# Patient Record
Sex: Male | Born: 1960 | Race: White | Hispanic: No | Marital: Married | Smoking: Former smoker
Health system: Southern US, Community
[De-identification: ages and names within clinical notes are randomized; demographics above are authoritative.]

## PROBLEM LIST (undated history)

## (undated) DIAGNOSIS — Z8669 Personal history of other diseases of the nervous system and sense organs: Secondary | ICD-10-CM

## (undated) HISTORY — PX: OTHER SURGICAL HISTORY: SHX169

## (undated) HISTORY — DX: Personal history of other diseases of the nervous system and sense organs: Z86.69

---

## 2004-11-15 ENCOUNTER — Emergency Department (HOSPITAL_COMMUNITY): Admission: EM | Admit: 2004-11-15 | Discharge: 2004-11-15 | Payer: Self-pay | Admitting: Emergency Medicine

## 2010-11-18 ENCOUNTER — Encounter: Payer: Self-pay | Admitting: Family Medicine

## 2010-11-18 ENCOUNTER — Ambulatory Visit (INDEPENDENT_AMBULATORY_CARE_PROVIDER_SITE_OTHER): Payer: BC Managed Care – PPO | Admitting: Family Medicine

## 2010-11-18 VITALS — BP 135/79 | HR 55 | Temp 98.0°F | Ht 69.5 in | Wt 164.8 lb

## 2010-11-18 DIAGNOSIS — S0033XA Contusion of nose, initial encounter: Secondary | ICD-10-CM

## 2010-11-18 DIAGNOSIS — S1093XA Contusion of unspecified part of neck, initial encounter: Secondary | ICD-10-CM

## 2010-11-18 DIAGNOSIS — S0003XA Contusion of scalp, initial encounter: Secondary | ICD-10-CM

## 2010-11-18 NOTE — Progress Notes (Signed)
Office Note 11/18/2010  CC:  Chief Complaint  Patient presents with  . Establish Care    new patient  . Facial Injury    ? broken nose X 1 day    HPI:  Michael Downs is a 50 y.o. White male who is here to establish care and discuss recent nose injury. Patient's most recent primary MD: none Old records were not reviewed prior to or during today's visit.  Yesterday he was using a drill to work on a piece of wood at his home and the bit got stuck and the wood spun and struck him in the nose. A small cut on the bridge bled some but he had no bleeding from the nares.  No LOC.  He has had mild pain in the nasal bridge since then, takes tylenol and this helps adequately.  He reports that his nasal bridge was already a bit deviated to the right and a sinus CT scan done in the past for w/u of HA's did show a deviated septum at that time.  Denies any problem breathing through either nostril.  No vision complaints.  No teeth complaints.    Past Medical History  Diagnosis Date  . Migraine   . Lipoma     multiple (right thigh, right hip, torso)    Past Surgical History  Procedure Date  . Right hand surgery 25 yrs ago    for traumatic wound to fingers    No family history on file.  History   Social History  . Marital Status: Married    Spouse Name: N/A    Number of Children: N/A  . Years of Education: N/A   Occupational History  . Not on file.   Social History Main Topics  . Smoking status: Former Smoker    Types: Cigarettes    Quit date: 03/31/1988  . Smokeless tobacco: Never Used  . Alcohol Use: Yes     2 or 3 a month  . Drug Use: No  . Sexually Active: Yes -- Male partner(s)   Other Topics Concern  . Not on file   Social History Narrative   Married, two teenage children at home. 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  He's originally from [place].No tobacco, rare ETOH, no drugs.Exercise --at work only.    Outpatient  Encounter Prescriptions as of 11/18/2010  Medication Sig Dispense Refill  . acetaminophen (TYLENOL) 500 MG tablet Take 500 mg by mouth every 6 (six) hours as needed.          Allergies  Allergen Reactions  . Bee Venom   . Influenza Vaccine Live Other (See Comments)    Both eyes turned red per pt report    ROS Review of Systems  Constitutional: Negative for fever and fatigue.  HENT: Negative for congestion and sore throat.   Eyes: Negative for visual disturbance.  Respiratory: Negative for cough.   Cardiovascular: Negative for chest pain.  Gastrointestinal: Negative for nausea and abdominal pain.  Genitourinary: Negative for dysuria.  Musculoskeletal: Negative for back pain and joint swelling.  Skin: Negative for rash.  Neurological: Negative for weakness and headaches.  Hematological: Negative for adenopathy.     PE; Blood pressure 135/79, pulse 55, temperature 98 F (36.7 C), temperature source Oral, height 5' 9.5" (1.765 m), weight 164 lb 12.8 oz (74.753 kg), SpO2 100.00%. Gen: Alert, well appearing.  Patient is oriented to person, place, time, and situation. HEENT: Scalp without lesions or hair loss.  Ears:  EACs clear, normal epithelium.  TMs with good light reflex and landmarks bilaterally.  Eyes: no injection, icteris, swelling, or exudate.  EOMI, PERRLA. Nose: Slight deviation of the bridge to the right, with mild TTP in proximal nasal bridge and slight bruising and soft tissue swelling in this area.  Small superficial lac on left side of bridge without surrounding erythema.   He has slight deviation of the septum to the right, without any blood or hematoma visible.  No nasal obtruction.   Remainder of facial bones without any tenderness.  Mouth: lips without lesion/swelling.  Oral mucosa pink and moist.  Dentition intact and without obvious caries or gingival swelling.  Oropharynx without erythema, exudate, or swelling.   Pertinent labs:  none  ASSESSMENT AND PLAN:   New  patient:  Contusion, nose He may have a fracture of the bridge of the nose, but from his description of his nose prior to the injury I don't think there is any NEW deformity. We discussed options today of nose imaging, ENT referral, or simply pain control and observation. Ultimately we decided on continued pain control with tylenol and no imaging or referral at this time.     Return if symptoms worsen or fail to improve, for make appt at your convenience for CPE.

## 2010-11-18 NOTE — Assessment & Plan Note (Signed)
He may have a fracture of the bridge of the nose, but from his description of his nose prior to the injury I don't think there is any NEW deformity. We discussed options today of nose imaging, ENT referral, or simply pain control and observation. Ultimately we decided on continued pain control with tylenol and no imaging or referral at this time.

## 2012-07-19 ENCOUNTER — Ambulatory Visit (INDEPENDENT_AMBULATORY_CARE_PROVIDER_SITE_OTHER): Payer: BC Managed Care – PPO | Admitting: Family Medicine

## 2012-07-19 ENCOUNTER — Encounter: Payer: Self-pay | Admitting: Family Medicine

## 2012-07-19 VITALS — BP 112/73 | HR 71 | Temp 98.2°F | Resp 16 | Wt 175.5 lb

## 2012-07-19 DIAGNOSIS — L723 Sebaceous cyst: Secondary | ICD-10-CM

## 2012-07-19 DIAGNOSIS — L720 Epidermal cyst: Secondary | ICD-10-CM | POA: Insufficient documentation

## 2012-07-19 NOTE — Progress Notes (Signed)
OFFICE NOTE  07/19/2012  CC:  Chief Complaint  Patient presents with  . Cyst    Pt c/o painful cyst on posterior right leg, top of thigh x5 days, without drainage.     HPI: Patient is a 52 y.o. Caucasian male who is here for focal swelling in back of right thigh.  He first noted it 4-5d/a.  No drainage. Located medially, painful to touch somewhat and painful when he sits.  When he stands or walks he is fine. No fever, no malaise.  Energy level and appetite are good.  Pertinent PMH:  Past Medical History  Diagnosis Date  . Migraine   . Lipoma     multiple (right thigh, right hip, torso)    MEDS:  Outpatient Prescriptions Prior to Visit  Medication Sig Dispense Refill  . acetaminophen (TYLENOL) 500 MG tablet Take 500 mg by mouth every 6 (six) hours as needed.         No facility-administered medications prior to visit.    PE: Blood pressure 112/73, pulse 71, temperature 98.2 F (36.8 C), temperature source Oral, resp. rate 16, weight 175 lb 8 oz (79.606 kg), SpO2 100.00%. Gen: Alert, well appearing.  Patient is oriented to person, place, time, and situation. Medial, upper portion of right thigh (posteriorly) has a 2-3 cm palpable subQ nodule that is mildly fluctuant in the center and with overlying pearly hue to skin but no skin opening.  No erythema.  Mild tenderness to palpation.     IMPRESSION AND PLAN:  Epidermal inclusion cyst Discussed conservative approach: apply HEAT every night as much as he can and if drainage begins then gently squeeze/massage the surrounding tissue to empty to cyst. If not better in 3d then at his f/u visit we'll do I &D No sign of infection at this time.  An After Visit Summary was printed and given to the patient.  FOLLOW UP: 3d

## 2012-07-19 NOTE — Assessment & Plan Note (Signed)
Discussed conservative approach: apply HEAT every night as much as he can and if drainage begins then gently squeeze/massage the surrounding tissue to empty to cyst. If not better in 3d then at his f/u visit we'll do I &D No sign of infection at this time.

## 2012-07-22 ENCOUNTER — Encounter: Payer: Self-pay | Admitting: Family Medicine

## 2012-07-22 ENCOUNTER — Ambulatory Visit (INDEPENDENT_AMBULATORY_CARE_PROVIDER_SITE_OTHER): Payer: BC Managed Care – PPO | Admitting: Family Medicine

## 2012-07-22 VITALS — BP 134/77 | HR 63 | Temp 98.5°F | Resp 16 | Wt 174.8 lb

## 2012-07-22 DIAGNOSIS — L0291 Cutaneous abscess, unspecified: Secondary | ICD-10-CM

## 2012-07-22 DIAGNOSIS — L0390 Cellulitis, unspecified: Secondary | ICD-10-CM

## 2012-07-22 MED ORDER — HYDROCODONE-ACETAMINOPHEN 5-325 MG PO TABS: ORAL_TABLET | ORAL | Status: DC

## 2012-07-22 NOTE — Progress Notes (Signed)
OFFICE NOTE  07/22/2012  CC:  Chief Complaint  Patient presents with  . Follow-up    3-day [Cyst on posterior right thigh] w/pain     HPI: Patient is a 52 y.o. Caucasian male who is here for 3 day f/u of cyst/boil on left posterior/medial thigh. He reports worse pain, enlarging lesion.  No fever or malaise.  Not taking any med for pain. Used an exacto knife last night to poke a hole in it and got minimal return of a substance that was foul smelling.  Pertinent PMH:  Past Medical History  Diagnosis Date  . Migraine   . Lipoma     multiple (right thigh, right hip, torso)     MEDS:  NONE  PE: Blood pressure 134/77, pulse 63, temperature 98.5 F (36.9 C), temperature source Oral, resp. rate 16, weight 174 lb 12 oz (79.266 kg), SpO2 99.00%. Gen: Alert, well appearing.  Patient is oriented to person, place, time, and situation. Left medial gluteal region with golf ball sized subQ swelling that is firm and tender and mildly eythematous.  No drainage opening.  This area of swelling is well circumscribed and is not in the region of the anus.  IMPRESSION AND PLAN:  Gluteal cyst or abscess. Needs incision and drainage today-failed conservative mgmt.  Procedure: Incision and drainage of abscess/cyst.  The indication for the procedure was explained to the patient, benefits and risks of procedure were outlined for patient, patient agreed to proceed.  Steps of the procedure were clearly explained to the patient prior to starting. Injected lesion with 3 ml of 2% lidocaine with epi for local anesthesia.  Incised central portion of lesion and cut out a small ellipse of skin and subQ tissue with scalpel and used manual pressure and hemostats to express contents and encourage complete drainage.  Culture swab of lesion contents obtained and sent to lab.  Wound dressed but not packed.  No bleeding.  Patient tolerated procedure well.  No immediate complications.  Wound care instructions given.   Warning signs of infection discussed. Follow up discussed--6d with me, earlier if fevers or increasing pain/swelling.  Call or return for problems. Ibuprofen 600mg  tid with food. Gave vicodin 5/325, 1-2 tabs q6h prn severe pain, #20, no RF.  Therapeutic expectations and side effect profile of medication discussed today.  Patient's questions answered.

## 2012-07-26 LAB — WOUND CULTURE

## 2012-07-28 ENCOUNTER — Ambulatory Visit (INDEPENDENT_AMBULATORY_CARE_PROVIDER_SITE_OTHER): Payer: BC Managed Care – PPO | Admitting: Family Medicine

## 2012-07-28 ENCOUNTER — Encounter: Payer: Self-pay | Admitting: Family Medicine

## 2012-07-28 VITALS — BP 116/74 | HR 52 | Temp 98.0°F | Resp 16 | Wt 174.0 lb

## 2012-07-28 DIAGNOSIS — L723 Sebaceous cyst: Secondary | ICD-10-CM

## 2012-07-28 DIAGNOSIS — L720 Epidermal cyst: Secondary | ICD-10-CM

## 2012-07-28 NOTE — Progress Notes (Signed)
OFFICE NOTE  07/28/2012  CC:  Chief Complaint  Patient presents with  . Follow-up    Post I&D Cyst     HPI: Patient is a 52 y.o. Caucasian male who is here for 3d f/u for I & D of gluteal cyst/boil. His wound culture came back negative.  His cyst is about 75% healed.  He feels good.  Pertinent PMH:  Past Medical History  Diagnosis Date  . Migraine   . Lipoma     multiple (right thigh, right hip, torso)    MEDS:  Outpatient Prescriptions Prior to Visit  Medication Sig Dispense Refill  . acetaminophen (TYLENOL) 500 MG tablet Take 500 mg by mouth every 6 (six) hours as needed.        Marland Kitchen HYDROcodone-acetaminophen (NORCO/VICODIN) 5-325 MG per tablet 1-2 tabs po q6h prn severe pain  20 tablet  0   No facility-administered medications prior to visit.    PE: Blood pressure 116/74, pulse 52, temperature 98 F (36.7 C), temperature source Oral, resp. rate 16, weight 174 lb (78.926 kg), SpO2 99.00%. Gen: Alert, well appearing.  Patient is oriented to person, place, time, and situation. Left glut: minimal induration/swelling of the tissue is palpable around the small incision/opening in his skin.  No erythema, tenderness, or active drainage.  IMPRESSION AND PLAN:  Skin cyst, s/p I&D 3 d/a---doing great. Continue to cover until sealed at surface. Continue heat until sealed. Signs/symptoms to call or return for were reviewed and pt expressed understanding.   FOLLOW UP: prn

## 2017-01-22 DIAGNOSIS — Z23 Encounter for immunization: Secondary | ICD-10-CM | POA: Diagnosis not present

## 2017-12-11 ENCOUNTER — Ambulatory Visit: Payer: BLUE CROSS/BLUE SHIELD | Admitting: Family Medicine

## 2017-12-11 ENCOUNTER — Encounter: Payer: Self-pay | Admitting: Family Medicine

## 2017-12-11 VITALS — BP 123/70 | HR 53 | Temp 98.4°F | Resp 16 | Ht 70.0 in | Wt 183.0 lb

## 2017-12-11 DIAGNOSIS — Z1211 Encounter for screening for malignant neoplasm of colon: Secondary | ICD-10-CM | POA: Diagnosis not present

## 2017-12-11 DIAGNOSIS — Z23 Encounter for immunization: Secondary | ICD-10-CM

## 2017-12-11 DIAGNOSIS — Z0000 Encounter for general adult medical examination without abnormal findings: Secondary | ICD-10-CM

## 2017-12-11 DIAGNOSIS — Z125 Encounter for screening for malignant neoplasm of prostate: Secondary | ICD-10-CM

## 2017-12-11 NOTE — Progress Notes (Signed)
Office Note 12/11/2017  CC:  Chief Complaint  Patient presents with  . Establish Care    Previous PCP: Unknown  . Hand Pain    right 3rd digit    HPI:  Michael Downs is a 57 y.o.  male who is here to re-establish care. Patient's most recent primary MD: none (I last saw him 5 yrs ago). Old records in [facility] EMR reviewed prior to or during today's visit.  Updated chart, no new information .  Feeling well. Exercise: only at work. Diet: I eat anything and everything. Eyes; annual exam UTD. Dental annual exam UTD.  Past Medical History:  Diagnosis Date  . History of migraine headaches   . Lipoma    multiple (right thigh, right hip, torso)    Past Surgical History:  Procedure Laterality Date  . right hand surgery  25 yrs ago   for traumatic wound to fingers    History reviewed. No pertinent family history.  Social History   Socioeconomic History  . Marital status: Married    Spouse name: Not on file  . Number of children: Not on file  . Years of education: Not on file  . Highest education level: Not on file  Occupational History  . Not on file  Social Needs  . Financial resource strain: Not on file  . Food insecurity:    Worry: Not on file    Inability: Not on file  . Transportation needs:    Medical: Not on file    Non-medical: Not on file  Tobacco Use  . Smoking status: Former Smoker    Types: Cigarettes    Last attempt to quit: 03/31/1988    Years since quitting: 29.7  . Smokeless tobacco: Never Used  Substance and Sexual Activity  . Alcohol use: Yes    Comment: 2 or 3 a month  . Drug use: No  . Sexual activity: Yes    Partners: Female  Lifestyle  . Physical activity:    Days per week: Not on file    Minutes per session: Not on file  . Stress: Not on file  Relationships  . Social connections:    Talks on phone: Not on file    Gets together: Not on file    Attends religious service: Not on file    Active member of club or organization:  Not on file    Attends meetings of clubs or organizations: Not on file    Relationship status: Not on file  . Intimate partner violence:    Fear of current or ex partner: Not on file    Emotionally abused: Not on file    Physically abused: Not on file    Forced sexual activity: Not on file  Other Topics Concern  . Not on file  Social History Narrative   Married, two teenage children at home.    Occupation: Editor, commissioning for [employer].   Enjoys woodworking.  He's originally from [place].   No tobacco, rare ETOH, no drugs.   Exercise --at work only.    Outpatient Encounter Medications as of 12/11/2017  Medication Sig  . acetaminophen (TYLENOL) 500 MG tablet Take 500 mg by mouth every 6 (six) hours as needed.    Marland Kitchen HYDROcodone-acetaminophen (NORCO/VICODIN) 5-325 MG per tablet 1-2 tabs po q6h prn severe pain (Patient not taking: Reported on 12/11/2017)   No facility-administered encounter medications on file as of 12/11/2017.     Allergies  Allergen Reactions  .  Bee Venom   . Influenza Vaccine Live Other (See Comments)    Both eyes turned red per pt report    ROS Review of Systems  Constitutional: Negative for appetite change, chills, fatigue and fever.  HENT: Negative for congestion, dental problem, ear pain and sore throat.   Eyes: Negative for discharge, redness and visual disturbance.  Respiratory: Negative for cough, chest tightness, shortness of breath and wheezing.   Cardiovascular: Negative for chest pain, palpitations and leg swelling.  Gastrointestinal: Negative for abdominal pain, blood in stool, diarrhea, nausea and vomiting.  Genitourinary: Negative for difficulty urinating, dysuria, flank pain, frequency, hematuria and urgency.  Musculoskeletal: Negative for arthralgias, back pain, joint swelling, myalgias and neck stiffness.  Skin: Negative for pallor and rash.  Neurological: Negative for dizziness, speech difficulty, weakness and headaches.   Hematological: Negative for adenopathy. Does not bruise/bleed easily.  Psychiatric/Behavioral: Negative for confusion and sleep disturbance. The patient is not nervous/anxious.     PE; Blood pressure 123/70, pulse (!) 53, temperature 98.4 F (36.9 C), temperature source Oral, resp. rate 16, height 5\' 10"  (1.778 m), weight 183 lb (83 kg), SpO2 97 %. Gen: Alert, well appearing.  Patient is oriented to person, place, time, and situation. AFFECT: pleasant, lucid thought and speech. ENT: Ears: EACs clear, normal epithelium.  TMs with good light reflex and landmarks bilaterally.  Eyes: no injection, icteris, swelling, or exudate.  EOMI, PERRLA. Nose: no drainage or turbinate edema/swelling.  No injection or focal lesion.  Mouth: lips without lesion/swelling.  Oral mucosa pink and moist.  Dentition intact and without obvious caries or gingival swelling.  Oropharynx without erythema, exudate, or swelling.  Neck: supple/nontender.  No LAD, mass, or TM.  Carotid pulses 2+ bilaterally, without bruits. CV: RRR, no m/r/g.   LUNGS: CTA bilat, nonlabored resps, good aeration in all lung fields. ABD: soft, NT, ND, BS normal.  No hepatospenomegaly or mass.  No bruits. EXT: no clubbing, cyanosis, or edema.  Musculoskeletal: no joint swelling, erythema, warmth, or tenderness.  ROM of all joints intact. Skin - no sores or suspicious lesions or rashes or color changes Rectal exam: negative without mass, lesions or tenderness, PROSTATE EXAM: smooth and symmetric without nodules or tenderness.  Pertinent labs:   none   ASSESSMENT AND PLAN:   New/re-establishing patient.  Health maintenance exam: Reviewed age and gender appropriate health maintenance issues (prudent diet, regular exercise, health risks of tobacco and excessive alcohol, use of seatbelts, fire alarms in home, use of sunscreen).  Also reviewed age and gender appropriate health screening as well as vaccine recommendations. Vaccines: tdap today.   He gets flu vaccine at work. Labs: fasting HP labs discussed, but since we're not sure if these are covered by his insurer when linked to HM exam, he will call insurer to check. Prostate ca screening:  DRE normal.  PSA to be done when pt returns for lab visit. Colon ca screening: he prefers fecal immunochemical testing so I ordered this today.  He understands that colonoscopy is the next step if this returns positive.  An After Visit Summary was printed and given to the patient.  Return in about 1 year (around 12/12/2018) for annual CPE (fasting).  Pt to call back and make fasting labs appointment at his convenience..  Signed:  Crissie Sickles, MD           12/11/2017

## 2017-12-11 NOTE — Patient Instructions (Addendum)
Call your insurer and ask if your plan covers routine blood work at physical exams (CBC, metabolic panel, TSH, and cholesterol panel).  Also, ask if they cover shingrix vaccine (shingles vaccine).  Make a lab appointment at our office to get blood drawn (even if your insurer does not cover the other blood tests, they will cover the prostate blood test b/c it is part of prostate cancer screening.   Health Maintenance, Male A healthy lifestyle and preventive care is important for your health and wellness. Ask your health care provider about what schedule of regular examinations is right for you. What should I know about weight and diet? Eat a Healthy Diet  Eat plenty of vegetables, fruits, whole grains, low-fat dairy products, and lean protein.  Do not eat a lot of foods high in solid fats, added sugars, or salt.  Maintain a Healthy Weight Regular exercise can help you achieve or maintain a healthy weight. You should:  Do at least 150 minutes of exercise each week. The exercise should increase your heart rate and make you sweat (moderate-intensity exercise).  Do strength-training exercises at least twice a week.  Watch Your Levels of Cholesterol and Blood Lipids  Have your blood tested for lipids and cholesterol every 5 years starting at 56 years of age. If you are at high risk for heart disease, you should start having your blood tested when you are 56 years old. You may need to have your cholesterol levels checked more often if: ? Your lipid or cholesterol levels are high. ? You are older than 56 years of age. ? You are at high risk for heart disease.  What should I know about cancer screening? Many types of cancers can be detected early and may often be prevented. Lung Cancer  You should be screened every year for lung cancer if: ? You are a current smoker who has smoked for at least 30 years. ? You are a former smoker who has quit within the past 15 years.  Talk to your health  care provider about your screening options, when you should start screening, and how often you should be screened.  Colorectal Cancer  Routine colorectal cancer screening usually begins at 56 years of age and should be repeated every 5-10 years until you are 56 years old. You may need to be screened more often if early forms of precancerous polyps or small growths are found. Your health care provider may recommend screening at an earlier age if you have risk factors for colon cancer.  Your health care provider may recommend using home test kits to check for hidden blood in the stool.  A small camera at the end of a tube can be used to examine your colon (sigmoidoscopy or colonoscopy). This checks for the earliest forms of colorectal cancer.  Prostate and Testicular Cancer  Depending on your age and overall health, your health care provider may do certain tests to screen for prostate and testicular cancer.  Talk to your health care provider about any symptoms or concerns you have about testicular or prostate cancer.  Skin Cancer  Check your skin from head to toe regularly.  Tell your health care provider about any new moles or changes in moles, especially if: ? There is a change in a mole's size, shape, or color. ? You have a mole that is larger than a pencil eraser.  Always use sunscreen. Apply sunscreen liberally and repeat throughout the day.  Protect yourself by wearing long sleeves,  pants, a wide-brimmed hat, and sunglasses when outside.  What should I know about heart disease, diabetes, and high blood pressure?  If you are 44-36 years of age, have your blood pressure checked every 3-5 years. If you are 59 years of age or older, have your blood pressure checked every year. You should have your blood pressure measured twice-once when you are at a hospital or clinic, and once when you are not at a hospital or clinic. Record the average of the two measurements. To check your blood  pressure when you are not at a hospital or clinic, you can use: ? An automated blood pressure machine at a pharmacy. ? A home blood pressure monitor.  Talk to your health care provider about your target blood pressure.  If you are between 64-81 years old, ask your health care provider if you should take aspirin to prevent heart disease.  Have regular diabetes screenings by checking your fasting blood sugar level. ? If you are at a normal weight and have a low risk for diabetes, have this test once every three years after the age of 40. ? If you are overweight and have a high risk for diabetes, consider being tested at a younger age or more often.  A one-time screening for abdominal aortic aneurysm (AAA) by ultrasound is recommended for men aged 53-75 years who are current or former smokers. What should I know about preventing infection? Hepatitis B If you have a higher risk for hepatitis B, you should be screened for this virus. Talk with your health care provider to find out if you are at risk for hepatitis B infection. Hepatitis C Blood testing is recommended for:  Everyone born from [birth year] through 1964.  Anyone with known risk factors for hepatitis C.  Sexually Transmitted Diseases (STDs)  You should be screened each year for STDs including gonorrhea and chlamydia if: ? You are sexually active and are younger than 56 years of age. ? You are older than 56 years of age and your health care provider tells you that you are at risk for this type of infection. ? Your sexual activity has changed since you were last screened and you are at an increased risk for chlamydia or gonorrhea. Ask your health care provider if you are at risk.  Talk with your health care provider about whether you are at high risk of being infected with HIV. Your health care provider may recommend a prescription medicine to help prevent HIV infection.  What else can I do?  Schedule regular health, dental, and eye  exams.  Stay current with your vaccines (immunizations).  Do not use any tobacco products, such as cigarettes, chewing tobacco, and e-cigarettes. If you need help quitting, ask your health care provider.  Limit alcohol intake to no more than 2 drinks per day. One drink equals 12 ounces of beer, 5 ounces of wine, or 1 ounces of hard liquor.  Do not use street drugs.  Do not share needles.  Ask your health care provider for help if you need support or information about quitting drugs.  Tell your health care provider if you often feel depressed.  Tell your health care provider if you have ever been abused or do not feel safe at home. This information is not intended to replace advice given to you by your health care provider. Make sure you discuss any questions you have with your health care provider. Document Released: 09/13/2007 Document Revised: 11/14/2015 Document Reviewed: 12/19/2014 Elsevier  Interactive Patient Education  [employer].

## 2017-12-11 NOTE — Addendum Note (Signed)
Addended by: Onalee Hua on: 12/11/2017 03:57 PM   Modules accepted: Orders

## 2018-02-10 DIAGNOSIS — Z23 Encounter for immunization: Secondary | ICD-10-CM | POA: Diagnosis not present

## 2018-07-16 DIAGNOSIS — H16042 Marginal corneal ulcer, left eye: Secondary | ICD-10-CM | POA: Diagnosis not present

## 2020-03-09 ENCOUNTER — Encounter: Payer: Self-pay | Admitting: Family Medicine

## 2020-03-09 ENCOUNTER — Ambulatory Visit (INDEPENDENT_AMBULATORY_CARE_PROVIDER_SITE_OTHER): Payer: BC Managed Care – PPO | Admitting: Family Medicine

## 2020-03-09 ENCOUNTER — Other Ambulatory Visit: Payer: Self-pay

## 2020-03-09 VITALS — BP 131/75 | HR 49 | Temp 97.9°F | Resp 16 | Ht 70.0 in | Wt 173.4 lb

## 2020-03-09 DIAGNOSIS — Z23 Encounter for immunization: Secondary | ICD-10-CM | POA: Diagnosis not present

## 2020-03-09 DIAGNOSIS — D1722 Benign lipomatous neoplasm of skin and subcutaneous tissue of left arm: Secondary | ICD-10-CM

## 2020-03-09 NOTE — Addendum Note (Signed)
Addended by: Deveron Furlong D on: 03/09/2020 04:22 PM   Modules accepted: Orders

## 2020-03-09 NOTE — Progress Notes (Signed)
OFFICE VISIT  03/09/2020  CC:  Chief Complaint  Patient presents with  . Bump under arm    Left, x 3-4 days    HPI:    Patient is a 59 y.o. male who presents for "bump under skin on arm". He noted small focal swelling/bump on left forearm and a little larger one on left upper arm sometime about 3-4 d/a.  No pain or itching, no skin changes. Has several lipomas on other areas of his body. Also hx of epidermal inclusion cyst that I I&Dd in the remote past.  Past Medical History:  Diagnosis Date  . History of migraine headaches   . Lipoma    multiple (right thigh, right hip, torso)    Past Surgical History:  Procedure Laterality Date  . right hand surgery  25 yrs ago   for traumatic wound to fingers    MEDS: none  Allergies  Allergen Reactions  . Bee Venom   . Influenza Vaccine Live Other (See Comments)    Both eyes turned red per pt report   ROS As per HPI  PE: Vitals with BMI 03/09/2020 12/11/2017 07/28/2012  Height 5\' 10"  5\' 10"  -  Weight 173 lbs 6 oz 183 lbs 174 lbs  BMI 70.35 00.93 -  Systolic 818 299 371  Diastolic 75 70 74  Pulse 49 53 52    Gen: Alert, well appearing.  Patient is oriented to person, place, time, and situation. Pea-sized subQ rubbery round nodule in volar aspect of L forearm---about 12-14 cm distal to antecubital fossa. Moveable, nontender, not attached to tendon. Similar lesion but about marble sized---located in anterolateral aspect of L upper arm about 12 cm proximal to antecubital fossa.   LABS:  none  IMPRESSION AND PLAN:  Left arm subQ nodules, most consistent with lipomas but also could be epidermal inclusion cysts.  Reassured pt of benign nature of the lesions.  Watchful waiting/obs approach.  Pt expressed interest in shingrix vaccine so we gave shingrix #1 today and he'll return in 2-6 months for injection #2.  An After Visit Summary was printed and given to the patient.  FOLLOW UP: Return for return as  needed.  Signed:  Crissie Sickles, MD           03/09/2020

## 2020-04-12 DIAGNOSIS — Z20822 Contact with and (suspected) exposure to covid-19: Secondary | ICD-10-CM | POA: Diagnosis not present

## 2020-12-13 ENCOUNTER — Other Ambulatory Visit: Payer: Self-pay

## 2020-12-13 ENCOUNTER — Ambulatory Visit (INDEPENDENT_AMBULATORY_CARE_PROVIDER_SITE_OTHER): Payer: BC Managed Care – PPO | Admitting: Family Medicine

## 2020-12-13 ENCOUNTER — Encounter: Payer: Self-pay | Admitting: Family Medicine

## 2020-12-13 VITALS — BP 122/74 | HR 57 | Temp 98.2°F | Resp 15 | Ht 70.0 in | Wt 170.2 lb

## 2020-12-13 DIAGNOSIS — R1011 Right upper quadrant pain: Secondary | ICD-10-CM | POA: Diagnosis not present

## 2020-12-13 DIAGNOSIS — Z23 Encounter for immunization: Secondary | ICD-10-CM

## 2020-12-13 NOTE — Progress Notes (Signed)
Subjective:  Patient ID: Michael Downs, male    DOB: 1960-09-07  Age: 60 y.o. MRN: [id number]  CC:  Chief Complaint  Patient presents with   Flank Pain    Pt reports Rt side pain at random yesterday went away last nights, felt pinches last night and this morning, denies confusion, numbness and tingling, no chest pains or palpitations.     HPI Michael Downs presents for  Flank pain:  Under R ribcage, noted yesterday am. Felt as stood up to work. Progressively worse, but able to work through pain. No fever, nausea/vomiting. No R shoulder pain. Pain improved during day. Just slight pressure. Felt ok this am, pinch when he stood up and again another time - resolved quickly. No current pain. Feel well otherwise. Eating/drinking ok.  No dyspnea, no cough.  No dysuria/hematuria, but felt better after urinating.  Normal BM yesterday afternoon. No recent constipation/diarrhea.  No hx of kidney stones.  No prior abdominal surgery.  No treatments.    History Patient Active Problem List   Diagnosis Date Noted   Epidermal inclusion cyst 07/19/2012   Contusion, nose 11/18/2010   Past Medical History:  Diagnosis Date   History of migraine headaches    Lipoma    multiple (right thigh, right hip, torso)   Past Surgical History:  Procedure Laterality Date   right hand surgery  25 yrs ago   for traumatic wound to fingers   Allergies  Allergen Reactions   Bee Venom    Influenza Vaccine Live Other (See Comments)    Both eyes turned red per pt report   Prior to Admission medications   Medication Sig Start Date End Date Taking? Authorizing Provider  acetaminophen (TYLENOL) 500 MG tablet Take 500 mg by mouth every 6 (six) hours as needed.   Patient not taking: No sig reported    [provider]  HYDROcodone-acetaminophen (NORCO/VICODIN) 5-325 MG per tablet 1-2 tabs po q6h prn severe pain Patient not taking: No sig reported 07/22/12   McGowen, Adrian Blackwater, MD   Social History    Socioeconomic History   Marital status: Married    Spouse name: Not on file   Number of children: Not on file   Years of education: Not on file   Highest education level: Not on file  Occupational History   Not on file  Tobacco Use   Smoking status: Former    Types: Cigarettes    Quit date: 03/31/1988    Years since quitting: 32.7   Smokeless tobacco: Never  Substance and Sexual Activity   Alcohol use: Yes    Comment: 2 or 3 a month   Drug use: No   Sexual activity: Yes    Partners: Female  Other Topics Concern   Not on file  Social History Narrative   Married, two teenage children at home.    Occupation: Editor, commissioning for [employer].   Enjoys woodworking.  He's originally from [place].   No tobacco, rare ETOH, no drugs.   Exercise --at work only.   Social Determinants of Health   Financial Resource Strain: Not on file  Food Insecurity: Not on file  Transportation Needs: Not on file  Physical Activity: Not on file  Stress: Not on file  Social Connections: Not on file  Intimate Partner Violence: Not on file    Review of Systems   Objective:   Vitals:   12/13/20 1554  BP: 122/74  Pulse: (!) 57  Resp: 15  Temp: 98.2 F (36.8 C)  TempSrc: Temporal  SpO2: 97%  Weight: 170 lb 3.2 oz (77.2 kg)  Height: '5\' 10"'$  (1.778 m)     Physical Exam Vitals reviewed.  Constitutional:      Appearance: He is well-developed.  HENT:     Head: Normocephalic and atraumatic.  Neck:     Vascular: No carotid bruit or JVD.  Cardiovascular:     Rate and Rhythm: Normal rate and regular rhythm.     Heart sounds: Normal heart sounds. No murmur heard. Pulmonary:     Effort: Pulmonary effort is normal.     Breath sounds: Normal breath sounds. No rales.  Abdominal:     General: Abdomen is flat. There is no distension.     Palpations: Abdomen is soft. There is no mass.     Tenderness: There is abdominal tenderness (Firm area in the right upper quadrant, appears to  be abdominal wall, no appreciable change with Valsalva/cough or sitting up.  Notes discomfort of this area.  Negative Murphy's, negative McBurney's point,). There is no right CVA tenderness, left CVA tenderness, guarding or rebound.  Musculoskeletal:     Right lower leg: No edema.     Left lower leg: No edema.  Skin:    General: Skin is warm and dry.  Neurological:     Mental Status: He is alert and oriented to person, place, and time.  Psychiatric:        Mood and Affect: Mood normal.     Assessment & Plan:  Michael Downs is a 60 y.o. male . RUQ pain - Plan: US Abdomen Complete Appears to be abdominal wall pain, did have some discomfort with right lateral flexion of trunk.  Firm area in the right upper quadrant, could be abdominal wall musculature versus hernia/spigelian hernia, less likely liver or gallbladder source but will check ultrasound initially, consider advanced imaging with CT depending on results.  ER precautions given if signs or symptoms of cholecystitis including nausea, vomiting or fever, but again unlikely.   Need for shingles vaccine - Plan: Varicella-zoster vaccine IM   No orders of the defined types were placed in this encounter.  Patient Instructions  Abdominal pain could be abdominal wall pain as we discussed which could be from a pulled muscle or a possible hernia in that area.  I will also check an ultrasound to evaluate for any nodules or concerns on the liver as that is also in that area as well as the gallbladder but this would be less likely a gallbladder attack or gallstone issue.  Additionally it would be less likely appendicitis or appendix issue based on the area and your current symptoms.  If you have any nausea, vomiting, fevers, or worsening abdominal pain be seen through the emergency room.  I will try to have the ultrasound ordered within the next few days if possible.  Depending on those results we can discuss other testing.  Please let me know if  there are questions and thank you for coming in today.    Signed,   Merri Ray, MD [facility] 12/13/20 5:13 PM

## 2020-12-13 NOTE — Patient Instructions (Signed)
Abdominal pain could be abdominal wall pain as we discussed which could be from a pulled muscle or a possible hernia in that area.  I will also check an ultrasound to evaluate for any nodules or concerns on the liver as that is also in that area as well as the gallbladder but this would be less likely a gallbladder attack or gallstone issue.  Additionally it would be less likely appendicitis or appendix issue based on the area and your current symptoms.  If you have any nausea, vomiting, fevers, or worsening abdominal pain be seen through the emergency room.  I will try to have the ultrasound ordered within the next few days if possible.  Depending on those results we can discuss other testing.  Please let me know if there are questions and thank you for coming in today.

## 2020-12-27 ENCOUNTER — Other Ambulatory Visit: Payer: BC Managed Care – PPO

## 2020-12-28 ENCOUNTER — Ambulatory Visit
Admission: RE | Admit: 2020-12-28 | Discharge: 2020-12-28 | Disposition: A | Discharge status: Home / Self Care | Payer: BC Managed Care – PPO | Source: Ambulatory Visit | Attending: Family Medicine | Admitting: Family Medicine

## 2020-12-28 ENCOUNTER — Other Ambulatory Visit: Payer: Self-pay

## 2020-12-28 DIAGNOSIS — R1901 Right upper quadrant abdominal swelling, mass and lump: Secondary | ICD-10-CM | POA: Diagnosis not present

## 2020-12-28 DIAGNOSIS — R1011 Right upper quadrant pain: Secondary | ICD-10-CM | POA: Diagnosis not present

## 2021-05-15 DIAGNOSIS — R001 Bradycardia, unspecified: Secondary | ICD-10-CM | POA: Diagnosis not present

## 2021-05-15 DIAGNOSIS — R079 Chest pain, unspecified: Secondary | ICD-10-CM | POA: Diagnosis not present

## 2021-05-15 DIAGNOSIS — Z20822 Contact with and (suspected) exposure to covid-19: Secondary | ICD-10-CM | POA: Diagnosis not present

## 2021-05-15 DIAGNOSIS — R0789 Other chest pain: Secondary | ICD-10-CM | POA: Diagnosis not present

## 2021-05-15 DIAGNOSIS — L905 Scar conditions and fibrosis of skin: Secondary | ICD-10-CM | POA: Diagnosis not present

## 2021-05-15 DIAGNOSIS — D0461 Carcinoma in situ of skin of right upper limb, including shoulder: Secondary | ICD-10-CM | POA: Diagnosis not present

## 2021-05-15 DIAGNOSIS — L309 Dermatitis, unspecified: Secondary | ICD-10-CM | POA: Diagnosis not present

## 2021-05-15 DIAGNOSIS — D485 Neoplasm of uncertain behavior of skin: Secondary | ICD-10-CM | POA: Diagnosis not present

## 2021-05-20 DIAGNOSIS — R079 Chest pain, unspecified: Secondary | ICD-10-CM | POA: Diagnosis not present

## 2021-06-07 DIAGNOSIS — R079 Chest pain, unspecified: Secondary | ICD-10-CM | POA: Diagnosis not present

## 2021-07-01 DIAGNOSIS — R079 Chest pain, unspecified: Secondary | ICD-10-CM | POA: Diagnosis not present

## 2021-08-01 ENCOUNTER — Ambulatory Visit: Payer: BC Managed Care – PPO | Admitting: Family Medicine

## 2021-08-01 ENCOUNTER — Encounter: Payer: Self-pay | Admitting: Family Medicine

## 2021-08-01 VITALS — BP 106/66 | HR 55 | Temp 98.6°F | Ht 70.0 in | Wt 170.8 lb

## 2021-08-01 DIAGNOSIS — M25562 Pain in left knee: Secondary | ICD-10-CM | POA: Diagnosis not present

## 2021-08-01 DIAGNOSIS — M222X2 Patellofemoral disorders, left knee: Secondary | ICD-10-CM

## 2021-08-01 DIAGNOSIS — M25561 Pain in right knee: Secondary | ICD-10-CM

## 2021-08-01 DIAGNOSIS — G8929 Other chronic pain: Secondary | ICD-10-CM

## 2021-08-01 DIAGNOSIS — M222X1 Patellofemoral disorders, right knee: Secondary | ICD-10-CM

## 2021-08-01 NOTE — Patient Instructions (Signed)
Patellofemoral Pain Syndrome  Patellofemoral pain syndrome is a condition in which the tissue (cartilage) on the underside of the kneecap (patella) softens or breaks down. This causes pain in the front of the knee. The condition is also called runner's knee or chondromalacia patella. Patellofemoral pain syndrome is most common in young adults who are active in sports. The knee is the largest joint in the body. The patella covers the front of the knee and is attached to muscles above and below the knee. The underside of the patella is covered with a smooth type of cartilage (synovium). The smooth surface helps the patella glide easily when you move your knee. Patellofemoral pain syndrome causes swelling in the joint linings and bone surfaces in the knee. What are the causes? This condition may be caused by: Overuse of the knee. Poor alignment of your knee joints. Weak leg muscles. A direct hit to your kneecap. What increases the risk? You are more likely to develop this condition if: You do a lot of activities that can wear down your kneecap. These include: Running. Squatting. Climbing stairs. You start a new physical activity or exercise program. You wear shoes that do not fit well. You do not have good leg strength. You are overweight. What are the signs or symptoms? The main symptom of this condition is knee pain. This may feel like a dull, aching pain underneath your patella, in the front of your knee. There may be a popping or cracking sound when you move your knee. Pain may get worse with: Exercise. Climbing stairs. Running. Jumping. Squatting. Kneeling. Sitting for a long time. Moving or pushing on your patella. How is this diagnosed? This condition may be diagnosed based on: Your symptoms and medical history. You may be asked about your recent physical activities and which ones cause knee pain. A physical exam. This may include: Moving your patella back and forth. Checking  your range of knee motion. Having you squat or jump to see if you have pain. Checking the strength of your leg muscles. Imaging tests to confirm the diagnosis. These may include an MRI of your knee. How is this treated? This condition may be treated at home with rest, ice, compression, and elevation (RICE).  Other treatments may include: NSAIDs, such as ibuprofen. Physical therapy to stretch and strengthen your leg muscles. Shoe inserts (orthotics) to take stress off your knee. A knee brace or knee support. Adhesive tapes to the skin. Surgery to remove damaged cartilage or move the patella to a better position. This is rare. Follow these instructions at home: If you have a brace: Wear the brace as told by your health care provider. Remove it only as told by your health care provider. Loosen the brace if your toes tingle, become numb, or turn cold and blue. Keep the brace clean. If the brace is not waterproof: Do not let it get wet. Cover it with a watertight covering when you take a bath or a shower. Managing pain, stiffness, and swelling  If directed, put ice on the painful area. To do this: If you have a removable brace, remove it as told by your health care provider. Put ice in a plastic bag. Place a towel between your skin and the bag. Leave the ice on for 20 minutes, 2-3 times a day. Remove the ice if your skin turns bright red. This is very important. If you cannot feel pain, heat, or cold, you have a greater risk of damage to the area.   Move your toes often to reduce stiffness and swelling. Raise (elevate) the injured area above the level of your heart while you are sitting or lying down. Activity Rest your knee. Avoid activities that cause knee pain. Perform stretching and strengthening exercises as told by your health care provider or physical therapist. Return to your normal activities as told by your health care provider. Ask your health care provider what activities are  safe for you. General instructions Take over-the-counter and prescription medicines only as told by your health care provider. Use splints, braces, knee supports, or walking aids as directed by your health care provider. Do not use any products that contain nicotine or tobacco, such as cigarettes, e-cigarettes, and chewing tobacco. These can delay healing. If you need help quitting, ask your health care provider. Keep all follow-up visits. This is important. Contact a health care provider if: Your symptoms get worse. You are not improving with home care. Summary Patellofemoral pain syndrome is a condition in which the tissue (cartilage) on the underside of the kneecap (patella) softens or breaks down. This condition causes swelling in the joint linings and bone surfaces in the knee. This leads to pain in the front of the knee. This condition may be treated at home with rest, ice, compression, and elevation (RICE). Use splints, braces, knee supports, or walking aids as directed by your health care provider. This information is not intended to replace advice given to you by your health care provider. Make sure you discuss any questions you have with your health care provider. Document Revised: 08/31/2019 Document Reviewed: 08/31/2019 Elsevier Patient Education  2023 Elsevier Inc.  

## 2021-08-01 NOTE — Progress Notes (Signed)
OFFICE VISIT ? ?08/01/2021 ? ?CC:  ?Chief Complaint  ?Patient presents with  ? Knee Pain  ?  Chronic; if he is on his knees longer than 5 minutes causes pain. Has been using tylenol,   ? ?Patient is a 61 y.o. male who presents for knee pain. ? ?HPI: ?Chronic bilateral knee pain, gradually progressing for years. ?Works on his knees frequently, squatting frequently. ?Sometimes hurts while he is working or walking long distances but the way he feels the most is after he is done a day or 2 of heavy use he feels it bad for the next few days. ?No swelling or redness.  No history of injury to the knees. ?Has never had evaluation for knee pain.  He does not take any medications for it. ? ?Occasionally his knee will buckle but not often.  No popping or locking up. ? ?Past Medical History:  ?Diagnosis Date  ? History of migraine headaches   ? Lipoma   ? multiple (right thigh, right hip, torso)  ? ? ?Past Surgical History:  ?Procedure Laterality Date  ? right hand surgery  25 yrs ago  ? for traumatic wound to fingers  ? ? ?Outpatient Medications Prior to Visit  ?Medication Sig Dispense Refill  ? acetaminophen (TYLENOL) 500 MG tablet Take 500 mg by mouth every 6 (six) hours as needed.    ? fluoruracil (CARAC) 0.5 % cream Apply topically daily.    ? HYDROcodone-acetaminophen (NORCO/VICODIN) 5-325 MG per tablet 1-2 tabs po q6h prn severe pain (Patient not taking: No sig reported) 20 tablet 0  ? ?No facility-administered medications prior to visit.  ? ? ?Allergies  ?Allergen Reactions  ? Bee Venom   ? Influenza Vaccine Live Other (See Comments)  ?  Both eyes turned red per pt report  ? ? ?ROS ?As per HPI ? ?PE: ? ?  08/01/2021  ?  3:02 PM 12/13/2020  ?  3:54 PM 03/09/2020  ?  3:26 PM  ?Vitals with BMI  ?Height '5\' 10"'$  '5\' 10"'$  '5\' 10"'$   ?Weight 170 lbs 13 oz 170 lbs 3 oz 173 lbs 6 oz  ?BMI 24.51 24.42 24.88  ?Systolic 540 086 761  ?Diastolic 66 74 75  ?Pulse 55 57 49  ? ? ? ?Physical Exam ? ?Gen: Alert, well appearing.  Patient is oriented  to person, place, time, and situation. ?Knees without swelling or erythema.  Range of motion fully intact bilaterally ?No distinct tenderness but some palpable discomfort along the medial aspect of the patella bilaterally. ?Quad tendon squeeze/contraction elicits mild pain.  No distinct joint line tenderness.  McMurray's negative.  No laxity or knee instability. ?No crepitus. ? ?LABS:  ?Last metabolic panel ?none ? ?IMPRESSION AND PLAN: ? ?Bilateral chronic knee pain. ?Most consistent with patellofemoral pain. ?(Bedside ultrasound today of both knees normal with the exception of a small pocket of anechoic change in the region of the medial patellar retinaculum of the right knee.  No hyperemia on power Doppler). ?Bilateral knee x-rays ordered to see how much osteoarthritis may be present. ?PT recommended/ordered. ?Recommended ibuprofen 600 mg twice daily as needed worsening of pain but do not take this more than a few days in a row. ? ?An After Visit Summary was printed and given to the patient. ? ?FOLLOW UP: Return in about 4 weeks (around 08/29/2021) for f/u knee pain. ? ?Signed:  Crissie Sickles, MD           08/01/2021 ? ?

## 2021-08-02 ENCOUNTER — Ambulatory Visit (HOSPITAL_BASED_OUTPATIENT_CLINIC_OR_DEPARTMENT_OTHER)
Admission: RE | Admit: 2021-08-02 | Discharge: 2021-08-02 | Disposition: A | Discharge status: Home / Self Care | Payer: BC Managed Care – PPO | Source: Ambulatory Visit | Attending: Family Medicine | Admitting: Family Medicine

## 2021-08-02 DIAGNOSIS — M222X1 Patellofemoral disorders, right knee: Secondary | ICD-10-CM

## 2021-08-02 DIAGNOSIS — M25561 Pain in right knee: Secondary | ICD-10-CM | POA: Insufficient documentation

## 2021-08-02 DIAGNOSIS — G8929 Other chronic pain: Secondary | ICD-10-CM | POA: Diagnosis not present

## 2021-08-02 DIAGNOSIS — M222X2 Patellofemoral disorders, left knee: Secondary | ICD-10-CM | POA: Diagnosis not present

## 2021-08-02 DIAGNOSIS — M25562 Pain in left knee: Secondary | ICD-10-CM | POA: Insufficient documentation

## 2021-08-02 DIAGNOSIS — M1711 Unilateral primary osteoarthritis, right knee: Secondary | ICD-10-CM | POA: Diagnosis not present

## 2021-08-09 DIAGNOSIS — M222X2 Patellofemoral disorders, left knee: Secondary | ICD-10-CM | POA: Diagnosis not present

## 2021-08-09 DIAGNOSIS — M222X1 Patellofemoral disorders, right knee: Secondary | ICD-10-CM | POA: Diagnosis not present

## 2021-08-15 DIAGNOSIS — M222X1 Patellofemoral disorders, right knee: Secondary | ICD-10-CM | POA: Diagnosis not present

## 2021-08-15 DIAGNOSIS — M222X2 Patellofemoral disorders, left knee: Secondary | ICD-10-CM | POA: Diagnosis not present

## 2021-08-22 DIAGNOSIS — M222X1 Patellofemoral disorders, right knee: Secondary | ICD-10-CM | POA: Diagnosis not present

## 2021-08-22 DIAGNOSIS — M222X2 Patellofemoral disorders, left knee: Secondary | ICD-10-CM | POA: Diagnosis not present

## 2021-09-04 ENCOUNTER — Ambulatory Visit: Payer: BC Managed Care – PPO | Admitting: Family Medicine

## 2021-09-04 ENCOUNTER — Encounter: Payer: Self-pay | Admitting: Family Medicine

## 2021-09-04 VITALS — BP 111/70 | HR 64 | Temp 98.2°F | Ht 70.0 in | Wt 173.6 lb

## 2021-09-04 DIAGNOSIS — M25562 Pain in left knee: Secondary | ICD-10-CM

## 2021-09-04 DIAGNOSIS — M25561 Pain in right knee: Secondary | ICD-10-CM

## 2021-09-04 DIAGNOSIS — G8929 Other chronic pain: Secondary | ICD-10-CM

## 2021-09-04 MED ORDER — MELOXICAM 15 MG PO TABS: ORAL_TABLET | ORAL | 0 refills | Status: AC

## 2021-09-04 NOTE — Progress Notes (Signed)
OFFICE VISIT  09/04/2021  CC:  Chief Complaint  Patient presents with   Knee Pain    Pt is dong therapy but pain is about the same.    Patient is a 61 y.o. male who presents for 1 mo f/u knee pain.  INTERIM HX: Says pain in both knees continues, right a bit worse than left. Says physical therapy has not helped any. No swelling or redness.  He is not taking any over-the-counter medications.  He has been icing the knees pretty regularly.  Plain films of left knee on May 5 was normal.  Plain films of right knee on May 5 that showed mild patellofemoral compartment arthritis.  Past Medical History:  Diagnosis Date   History of migraine headaches    Lipoma    multiple (right thigh, right hip, torso)    Past Surgical History:  Procedure Laterality Date   right hand surgery  25 yrs ago   for traumatic wound to fingers    Outpatient Medications Prior to Visit  Medication Sig Dispense Refill   acetaminophen (TYLENOL) 500 MG tablet Take 500 mg by mouth every 6 (six) hours as needed.     fluoruracil (CARAC) 0.5 % cream Apply topically daily.     No facility-administered medications prior to visit.    Allergies  Allergen Reactions   Bee Venom    Influenza Vaccine Live Other (See Comments)    Both eyes turned red per pt report    ROS As per HPI  PE:    09/04/2021    2:34 PM 08/01/2021    3:02 PM 12/13/2020    3:54 PM  Vitals with BMI  Height '5\' 10"'$  '5\' 10"'$  '5\' 10"'$   Weight 173 lbs 10 oz 170 lbs 13 oz 170 lbs 3 oz  BMI 24.91 83.33 83.29  Systolic 191 660 600  Diastolic 70 66 74  Pulse 64 55 57   Physical Exam  General: Alert and well-appearing. No further exam today.  LABS:  none  IMPRESSION AND PLAN:  Chronic bilateral knee pain. Minimal amount of osteoarthritis noted on plain films last month. PT not helping. Discussed option of trial of steroid injection today versus specialist referral.  He opted for specialist referral, specifically requested Dr. Harolyn Rutherford  at [facility] in [place]. In the meantime, will try meloxicam 15 mg a day for 2-week course.  An After Visit Summary was printed and given to the patient.  FOLLOW UP: Return for follow up as needed.  Signed:  Crissie Sickles, MD           09/04/2021

## 2021-09-11 DIAGNOSIS — M25562 Pain in left knee: Secondary | ICD-10-CM | POA: Diagnosis not present

## 2021-09-11 DIAGNOSIS — M25561 Pain in right knee: Secondary | ICD-10-CM | POA: Diagnosis not present

## 2021-12-11 DIAGNOSIS — D225 Melanocytic nevi of trunk: Secondary | ICD-10-CM | POA: Diagnosis not present

## 2021-12-11 DIAGNOSIS — D1801 Hemangioma of skin and subcutaneous tissue: Secondary | ICD-10-CM | POA: Diagnosis not present

## 2021-12-11 DIAGNOSIS — L821 Other seborrheic keratosis: Secondary | ICD-10-CM | POA: Diagnosis not present

## 2021-12-11 DIAGNOSIS — L578 Other skin changes due to chronic exposure to nonionizing radiation: Secondary | ICD-10-CM | POA: Diagnosis not present

## 2021-12-11 DIAGNOSIS — D485 Neoplasm of uncertain behavior of skin: Secondary | ICD-10-CM | POA: Diagnosis not present

## 2021-12-31 DIAGNOSIS — R079 Chest pain, unspecified: Secondary | ICD-10-CM | POA: Diagnosis not present

## 2022-01-03 DIAGNOSIS — Z13228 Encounter for screening for other metabolic disorders: Secondary | ICD-10-CM | POA: Diagnosis not present

## 2022-01-03 DIAGNOSIS — Z13220 Encounter for screening for lipoid disorders: Secondary | ICD-10-CM | POA: Diagnosis not present

## 2022-01-03 DIAGNOSIS — R079 Chest pain, unspecified: Secondary | ICD-10-CM | POA: Diagnosis not present

## 2022-01-03 DIAGNOSIS — E878 Other disorders of electrolyte and fluid balance, not elsewhere classified: Secondary | ICD-10-CM | POA: Diagnosis not present

## 2022-01-03 DIAGNOSIS — D7218 Eosinophilia in diseases classified elsewhere: Secondary | ICD-10-CM | POA: Diagnosis not present

## 2022-09-17 DIAGNOSIS — M25561 Pain in right knee: Secondary | ICD-10-CM | POA: Diagnosis not present

## 2022-09-17 DIAGNOSIS — M25562 Pain in left knee: Secondary | ICD-10-CM | POA: Diagnosis not present

## 2022-10-15 DIAGNOSIS — W268XXA Contact with other sharp object(s), not elsewhere classified, initial encounter: Secondary | ICD-10-CM | POA: Diagnosis not present

## 2022-10-15 DIAGNOSIS — W228XXA Striking against or struck by other objects, initial encounter: Secondary | ICD-10-CM | POA: Diagnosis not present

## 2022-10-15 DIAGNOSIS — S61211A Laceration without foreign body of left index finger without damage to nail, initial encounter: Secondary | ICD-10-CM | POA: Diagnosis not present

## 2022-12-10 DIAGNOSIS — L578 Other skin changes due to chronic exposure to nonionizing radiation: Secondary | ICD-10-CM | POA: Diagnosis not present

## 2022-12-10 DIAGNOSIS — L821 Other seborrheic keratosis: Secondary | ICD-10-CM | POA: Diagnosis not present

## 2022-12-10 DIAGNOSIS — D1801 Hemangioma of skin and subcutaneous tissue: Secondary | ICD-10-CM | POA: Diagnosis not present

## 2022-12-10 DIAGNOSIS — L814 Other melanin hyperpigmentation: Secondary | ICD-10-CM | POA: Diagnosis not present

## 2023-04-19 IMAGING — US US ABDOMEN COMPLETE
1 series · 13 of 25 positions shown · non-contrast
Comparison: None.

CLINICAL DATA: Right upper quadrant pain 24 hours. Small firm
palpable area over the anterolateral right upper abdomen just below
the ribs.

EXAM:
ABDOMEN ULTRASOUND COMPLETE

[Series 1: us abdomen complete · 0.15mm/px · 89 acquisitions, 13 frames shown]
[im 1/89]
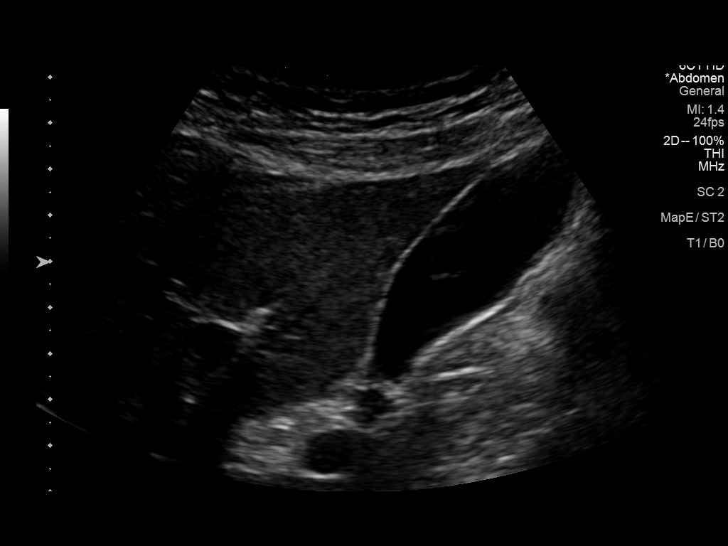
[im 8/89]
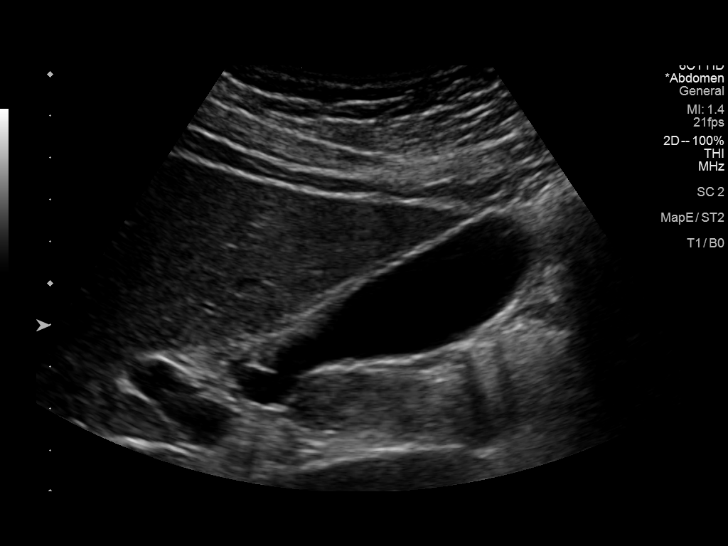
[im 15/89]
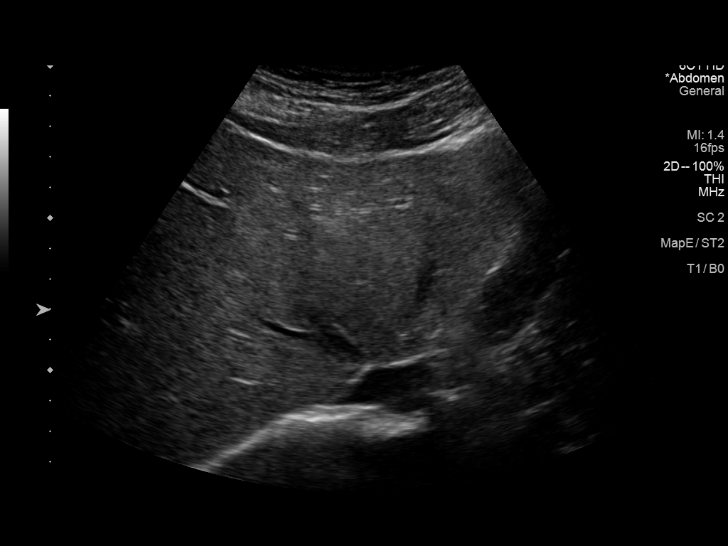
[im 23/89]
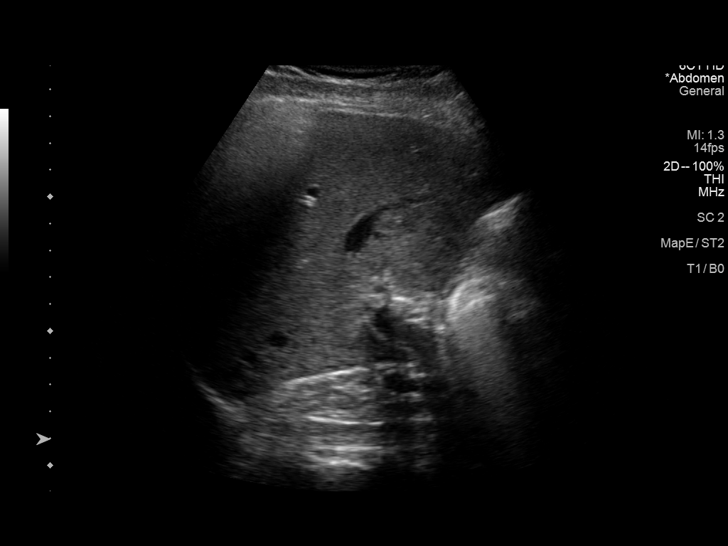
[im 30/89]
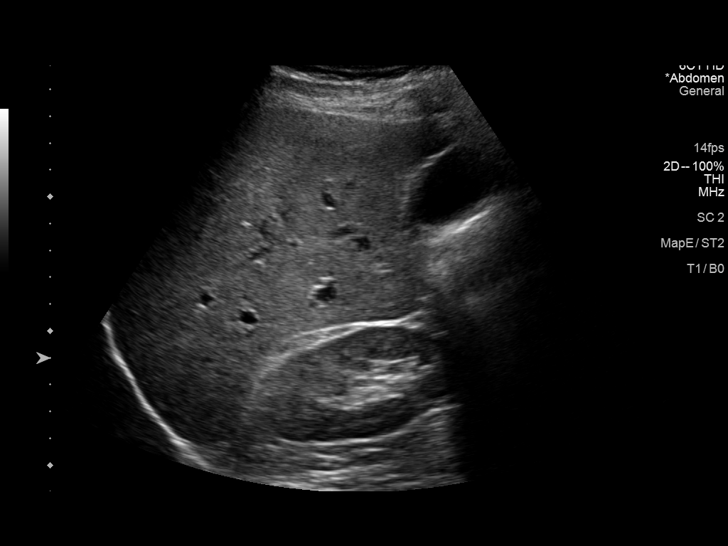
[im 37/89]
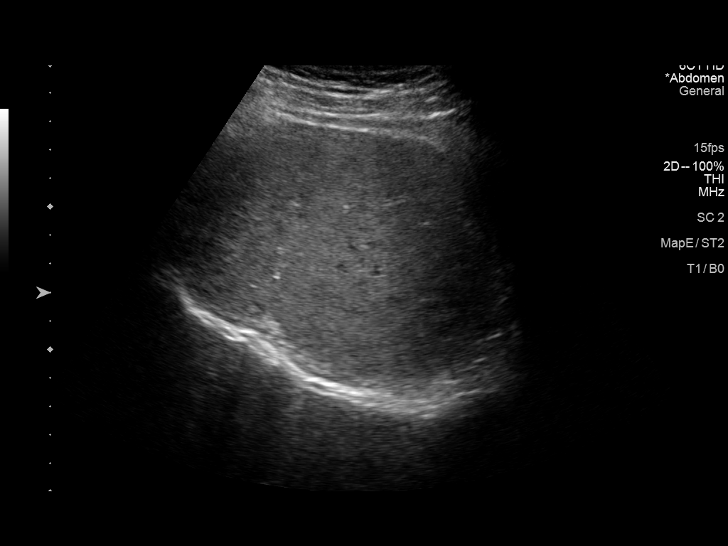
[im 45/89]
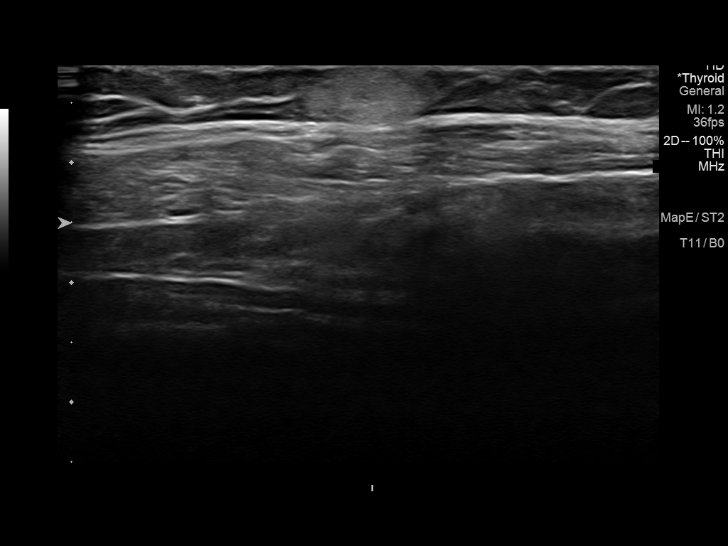
[im 52/89]
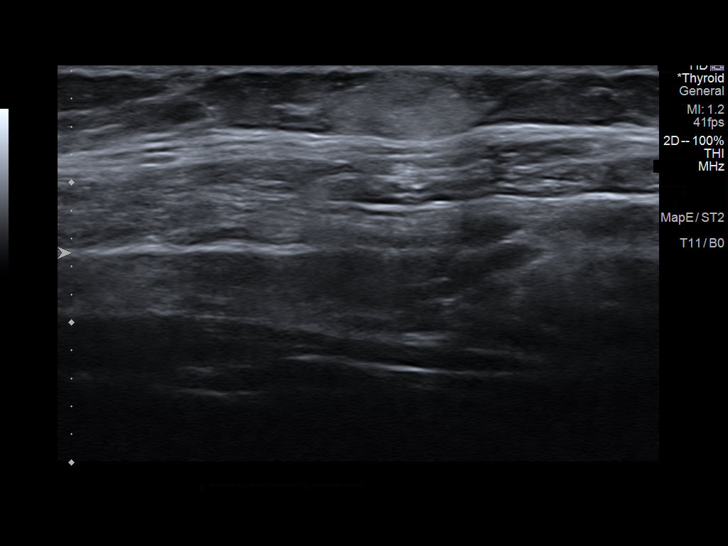
[im 59/89]
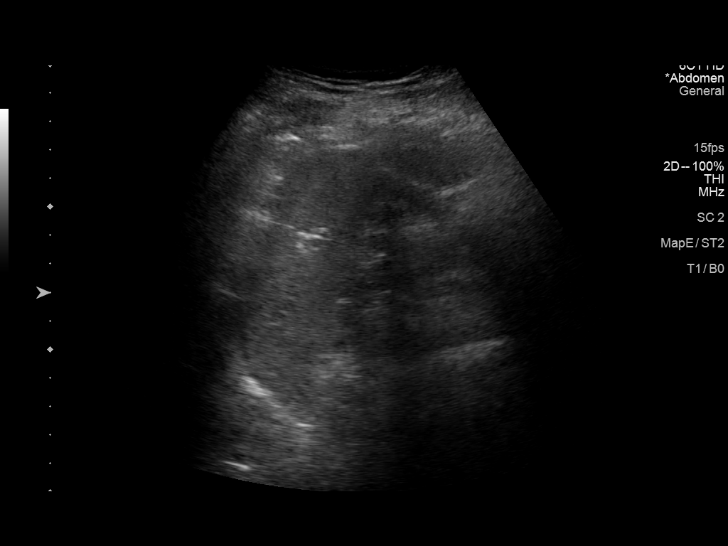
[im 67/89]
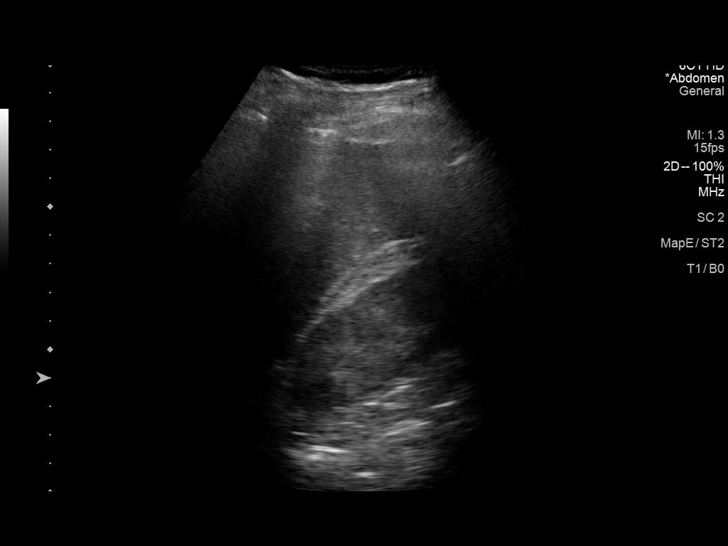
[im 74/89]
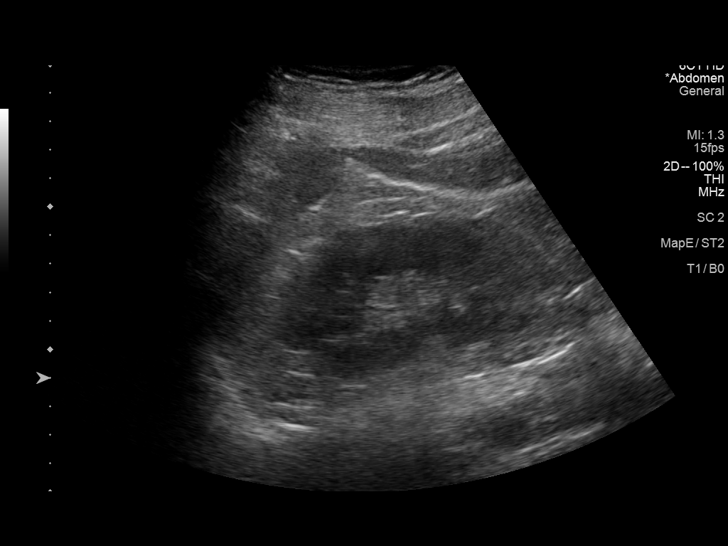
[im 81/89]
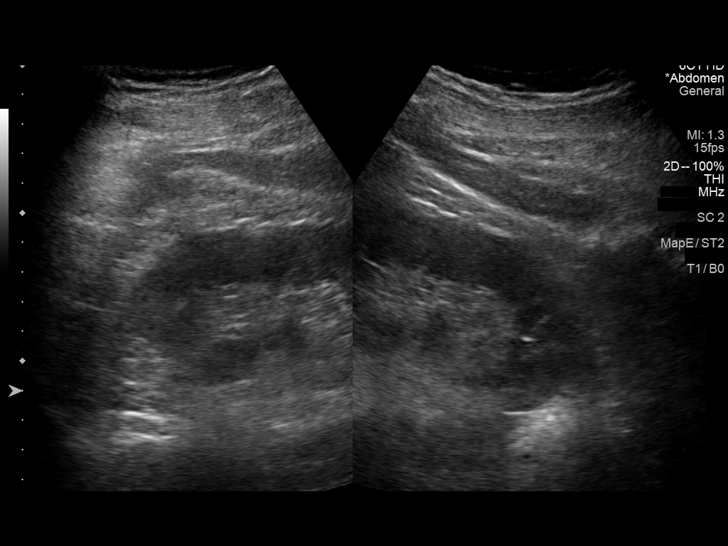
[im 89/89]
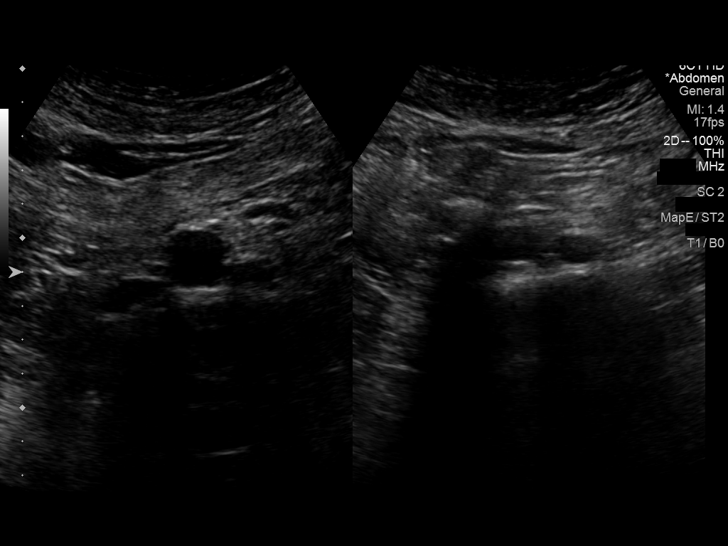

[13 of 25 positions shown; findings below may reference images not displayed]

FINDINGS: Gallbladder: No gallstones or wall thickening visualized. No
sonographic Murphy sign noted by sonographer.

Common bile duct: Diameter: 3.6 mm.

Liver: No focal lesion identified. Within normal limits in
parenchymal echogenicity. Portal vein is patent on color Doppler
imaging with normal direction of blood flow towards the liver.

IVC: No abnormality visualized.

Pancreas: Visualized portion unremarkable.

Spleen: Size and appearance within normal limits.

Right Kidney: Length: 11.0 cm. Echogenicity within normal limits. No
mass or hydronephrosis visualized.

Left Kidney: Length: 11.8 cm. Echogenicity within normal limits. No
mass or hydronephrosis visualized.

Abdominal aorta: No aneurysm visualized.

Other findings: Ultrasound evaluation over patient's palpable
abnormality over the anterolateral right upper abdominal wall
demonstrates a well-defined oval hyperechoic mass within the
subcutaneous fat just below the skin measuring 5 x 8 x 10 mm
indicating a lipoma.
IMPRESSION: 1.  No acute findings.

2. 1 cm hyperechoic mass just below the skin over the anterolateral
right upper abdominal wall corresponding to patient's palpable
abnormality and compatible with a small lipoma.

## 2023-09-10 DIAGNOSIS — M25562 Pain in left knee: Secondary | ICD-10-CM | POA: Diagnosis not present

## 2023-09-10 DIAGNOSIS — M25462 Effusion, left knee: Secondary | ICD-10-CM | POA: Diagnosis not present

## 2023-09-10 DIAGNOSIS — M25561 Pain in right knee: Secondary | ICD-10-CM | POA: Diagnosis not present
